# Patient Record
Sex: Female | Born: 1980 | Race: Black or African American | Hispanic: No | Smoking: Current every day smoker
Health system: Southern US, Community
[De-identification: ages and names within clinical notes are randomized; demographics above are authoritative.]

## PROBLEM LIST (undated history)

## (undated) ENCOUNTER — Inpatient Hospital Stay (HOSPITAL_COMMUNITY): Payer: Self-pay

## (undated) DIAGNOSIS — I10 Essential (primary) hypertension: Secondary | ICD-10-CM

## (undated) DIAGNOSIS — E119 Type 2 diabetes mellitus without complications: Secondary | ICD-10-CM

## (undated) HISTORY — PX: TONSILLECTOMY AND ADENOIDECTOMY: SHX28

## (undated) HISTORY — PX: TONSILLECTOMY: SUR1361

---

## 2011-10-15 HISTORY — PX: PILONIDAL CYST EXCISION: SHX744

## 2018-04-28 ENCOUNTER — Ambulatory Visit (INDEPENDENT_AMBULATORY_CARE_PROVIDER_SITE_OTHER): Payer: Self-pay | Admitting: *Deleted

## 2018-04-28 ENCOUNTER — Encounter: Payer: Self-pay | Admitting: General Practice

## 2018-04-28 DIAGNOSIS — Z3201 Encounter for pregnancy test, result positive: Secondary | ICD-10-CM

## 2018-04-28 DIAGNOSIS — Z3200 Encounter for pregnancy test, result unknown: Secondary | ICD-10-CM

## 2018-04-28 LAB — POCT PREGNANCY, URINE: Preg Test, Ur: POSITIVE — AB

## 2018-04-28 NOTE — Progress Notes (Signed)
Charity application given to patient. 

## 2018-04-28 NOTE — Progress Notes (Addendum)
Pt informed of +UPT today. She reports sure LMP 03/17/18. EDD 12/22/18 (6w 0d).  Medication reconciliation completed. Pt advised to begin prenatal vitamins. She  states she has Type 2 DM and CHTN.  She is new to the area and has not been taking medications for approximately 6 months for these conditions. She does not follow a diabetic diet or check blood sugars regularly. Pt was advised to begin adhering to diabetic diet. She denies heavy vaginal bleeding or abdominal/pelvic pain.  She was advised to return to hospital immediately if these sx occur. Pt voiced understanding of all information and instructions given. Upon check out, pt was scheduled for appt w/Diabetes Educator on 7/18 and New Ob appt on 8/5.  Attestation of Attending Supervision of RN: Evaluation and management procedures were performed by the nurse under my supervision and collaboration.  I have reviewed the nursing note and chart, and I agree with the management and plan.  Carolyn L. Harraway-Smith, M.D., Evern CoreFACOG

## 2018-04-30 ENCOUNTER — Encounter: Payer: Medicaid Other | Attending: Obstetrics and Gynecology | Admitting: *Deleted

## 2018-04-30 ENCOUNTER — Ambulatory Visit: Payer: Self-pay | Admitting: *Deleted

## 2018-04-30 DIAGNOSIS — Z3A00 Weeks of gestation of pregnancy not specified: Secondary | ICD-10-CM | POA: Insufficient documentation

## 2018-04-30 DIAGNOSIS — E1165 Type 2 diabetes mellitus with hyperglycemia: Secondary | ICD-10-CM | POA: Diagnosis not present

## 2018-04-30 DIAGNOSIS — Z713 Dietary counseling and surveillance: Secondary | ICD-10-CM | POA: Diagnosis not present

## 2018-04-30 DIAGNOSIS — O24111 Pre-existing diabetes mellitus, type 2, in pregnancy, first trimester: Principal | ICD-10-CM

## 2018-04-30 NOTE — Progress Notes (Signed)
Patient was seen on 04/30/2018 for type 2 Diabetes and pregnancy self-management. EDD 12/22/2018. Patient states history of this diabetes for 10 years.  Diet history obtained. Patient eats good variety of all food groups but beverages include sweet tea, fruit juices and water.  Her comfort level with carb counting is 0/10. Patient is currently on no diabetes medications as she lost her insurance and has not seen a doctor for over 6 months. She recently moved to [place] from the [place] area. She states she was previously on Metformin but could not tolerate the excessive diarrhea, she was then put on Byetta, but could not afford that once she lost her insurance.   The following learning objectives were met by the patient :   States the definition of type 2 Diabetes and pregnancy   States why dietary management is important in controlling blood glucose  Describes the effects of carbohydrates on blood glucose levels  Demonstrates ability to create a balanced meal plan  Demonstrates carbohydrate counting   States when to check blood glucose levels  Demonstrates proper blood glucose monitoring techniques  States the effect of stress and exercise on blood glucose levels  States the importance of limiting caffeine and abstaining from alcohol and smoking  Plan:   Aim for 3 Carb Choices per meal (45 grams) +/- 1 either way   Aim for 1-2 Carbs per snack  Begin reading food labels for Total Carbohydrate of foods  Consider  increasing your activity level by walking or other activity daily as tolerated  Begin checking BG before breakfast and 2 hours after first bite of breakfast, lunch and dinner as directed by MD   Bring Log Book/Sheet to every medical appointment OR use Baby Scripts   Patient was introduced to [employer], she plans to use as record of BG electronically  Take medication as directed by MD  Blood glucose monitor given: [employer] Track Lot # [id number]  Exp: 12/12/2019 Blood  glucose reading: 150 mg/dl after breakfast that included sweet tea  Patient instructed to monitor glucose levels: FBS: 60 - 95 mg/dl 2 hour: <120 mg/dl  Patient received the following handouts:  Nutrition Diabetes and Pregnancy  Carbohydrate Counting List  Patient will be seen for follow-up in 1 month

## 2018-05-04 ENCOUNTER — Encounter: Payer: Self-pay | Admitting: General Practice

## 2018-05-04 NOTE — Progress Notes (Unsigned)
Patient triggered in babyscripts for critical/elevated blood sugar readings. Per review, Dr Earlene Plateravis was contacted by babyscripts regarding her blood sugars. Patient has new OB appt on 8/5.

## 2018-05-06 ENCOUNTER — Encounter: Payer: Self-pay | Admitting: Medical

## 2018-05-06 ENCOUNTER — Other Ambulatory Visit: Payer: Self-pay

## 2018-05-06 ENCOUNTER — Inpatient Hospital Stay (HOSPITAL_COMMUNITY): Payer: Medicaid Other

## 2018-05-06 ENCOUNTER — Inpatient Hospital Stay (HOSPITAL_COMMUNITY)
Admission: AD | Admit: 2018-05-06 | Discharge: 2018-05-06 | Disposition: A | Discharge status: Home / Self Care | Payer: Medicaid Other | Source: Ambulatory Visit | Attending: Obstetrics and Gynecology | Admitting: Obstetrics and Gynecology

## 2018-05-06 DIAGNOSIS — Z882 Allergy status to sulfonamides status: Secondary | ICD-10-CM | POA: Insufficient documentation

## 2018-05-06 DIAGNOSIS — O4691 Antepartum hemorrhage, unspecified, first trimester: Secondary | ICD-10-CM | POA: Diagnosis present

## 2018-05-06 DIAGNOSIS — O200 Threatened abortion: Secondary | ICD-10-CM

## 2018-05-06 DIAGNOSIS — O209 Hemorrhage in early pregnancy, unspecified: Secondary | ICD-10-CM

## 2018-05-06 DIAGNOSIS — Z87891 Personal history of nicotine dependence: Secondary | ICD-10-CM | POA: Diagnosis not present

## 2018-05-06 DIAGNOSIS — O24911 Unspecified diabetes mellitus in pregnancy, first trimester: Secondary | ICD-10-CM | POA: Diagnosis not present

## 2018-05-06 DIAGNOSIS — O161 Unspecified maternal hypertension, first trimester: Secondary | ICD-10-CM | POA: Diagnosis not present

## 2018-05-06 DIAGNOSIS — Z3A01 Less than 8 weeks gestation of pregnancy: Secondary | ICD-10-CM | POA: Insufficient documentation

## 2018-05-06 DIAGNOSIS — Z885 Allergy status to narcotic agent status: Secondary | ICD-10-CM | POA: Diagnosis not present

## 2018-05-06 HISTORY — DX: Essential (primary) hypertension: I10

## 2018-05-06 HISTORY — DX: Type 2 diabetes mellitus without complications: E11.9

## 2018-05-06 LAB — ABO/RH: ABO/RH(D): O POS

## 2018-05-06 LAB — CBC WITH DIFFERENTIAL/PLATELET
BASOS PCT: 0 %
Basophils Absolute: 0 10*3/uL (ref 0.0–0.1)
EOS ABS: 0 10*3/uL (ref 0.0–0.7)
EOS PCT: 1 %
HCT: 38.8 % (ref 36.0–46.0)
HEMOGLOBIN: 14 g/dL (ref 12.0–15.0)
Lymphocytes Relative: 40 %
Lymphs Abs: 2.7 10*3/uL (ref 0.7–4.0)
MCH: 30.4 pg (ref 26.0–34.0)
MCHC: 36.1 g/dL — AB (ref 30.0–36.0)
MCV: 84.2 fL (ref 78.0–100.0)
MONOS PCT: 2 %
Monocytes Absolute: 0.1 10*3/uL (ref 0.1–1.0)
NEUTROS PCT: 57 %
Neutro Abs: 3.8 10*3/uL (ref 1.7–7.7)
PLATELETS: 292 10*3/uL (ref 150–400)
RBC: 4.61 MIL/uL (ref 3.87–5.11)
RDW: 11.8 % (ref 11.5–15.5)
WBC: 6.6 10*3/uL (ref 4.0–10.5)

## 2018-05-06 LAB — WET PREP, GENITAL
CLUE CELLS WET PREP: NONE SEEN
Sperm: NONE SEEN
Trich, Wet Prep: NONE SEEN
Yeast Wet Prep HPF POC: NONE SEEN

## 2018-05-06 LAB — HCG, QUANTITATIVE, PREGNANCY: HCG, BETA CHAIN, QUANT, S: 678 m[IU]/mL — AB (ref <5)

## 2018-05-06 LAB — URINALYSIS, ROUTINE W REFLEX MICROSCOPIC
Bacteria, UA: NONE SEEN
Bilirubin Urine: NEGATIVE
Glucose, UA: >=500 mg/dL — AB
Ketones, ur: NEGATIVE mg/dL
Nitrite: NEGATIVE
Protein, ur: NEGATIVE mg/dL
Specific Gravity, Urine: 1.016 (ref 1.005–1.030)
pH: 6 (ref 5.0–8.0)

## 2018-05-06 NOTE — MAU Provider Note (Signed)
History     CSN: [id number]  Arrival date and time: 05/06/18 4098   First Provider Initiated Contact with Patient 05/06/18 [phone]      Chief Complaint  Patient presents with  . Vaginal Bleeding  . Abdominal Pain   HPI Ms. Debra Floyd is a 37 y.o. G2P1001 at [redacted]w[redacted]d who presents to MAU today with complaint of vaginal bleeding and abdominal cramping. The patient states that she noted a pink discharge last week and that became heavier and darker ~ 3 days ago. She states last intercourse was last Tuesday. She rates her abdominal pain at 3-4/10. She has not taken anything for pain. She denies fever, N/V/D or UTI symptoms. She has CHTN and T2DM and is not currently on any medications.   OB History    Gravida  2   Para  1   Term  1   Preterm      AB      Living  1     SAB      TAB      Ectopic      Multiple      Live Births              Past Medical History:  Diagnosis Date  . Diabetes mellitus without complication (HCC)   . Hypertension     Past Surgical History:  Procedure Laterality Date  . CESAREAN SECTION    . TONSILLECTOMY    . TONSILLECTOMY AND ADENOIDECTOMY      History reviewed. No pertinent family history.  Social History   Tobacco Use  . Smoking status: Former Smoker    Types: Cigars    Last attempt to quit: 04/16/2018    Years since quitting: 0.0  . Smokeless tobacco: Never Used  Substance Use Topics  . Alcohol use: Not Currently  . Drug use: Never    Allergies:  Allergies  Allergen Reactions  . Hydrocodone Hives  . Sulfa Antibiotics Hives    No medications prior to admission.    Review of Systems  Constitutional: Negative for fever.  Gastrointestinal: Positive for abdominal pain. Negative for constipation, diarrhea, nausea and vomiting.  Genitourinary: Positive for vaginal bleeding and vaginal discharge. Negative for dysuria, frequency and urgency.   Physical Exam   Blood pressure (!) 142/86, pulse 67, temperature 98.4 F  (36.9 C), temperature source Oral, resp. rate 18, height 5\' 3"  (1.6 m), weight 241 lb 0.6 oz (109.3 kg), last menstrual period 03/17/2018, SpO2 100 %.  Physical Exam  Nursing note and vitals reviewed. Constitutional: She is oriented to person, place, and time. She appears well-developed and well-nourished. No distress.  HENT:  Head: Normocephalic and atraumatic.  Cardiovascular: Normal rate.  Respiratory: Effort normal.  GI: Soft. She exhibits no distension and no mass. There is no tenderness. There is no rebound and no guarding.  Genitourinary: Uterus is not enlarged and not tender. Cervix exhibits no motion tenderness, no discharge and no friability. Right adnexum displays no mass and no tenderness. Left adnexum displays no mass and no tenderness. There is bleeding (small) in the vagina. No vaginal discharge found.  Neurological: She is alert and oriented to person, place, and time.  Skin: Skin is warm and dry. No erythema.  Psychiatric: She has a normal mood and affect.    Results for orders placed or performed during the hospital encounter of 05/06/18 (from the past 24 hour(s))  Urinalysis, Routine w reflex microscopic     Status: Abnormal   Collection Time:  05/06/18  9:10 AM  Result Value Ref Range   Color, Urine YELLOW YELLOW   APPearance HAZY (A) CLEAR   Specific Gravity, Urine 1.016 1.005 - 1.030   pH 6.0 5.0 - 8.0   Glucose, UA >=500 (A) NEGATIVE mg/dL   Hgb urine dipstick LARGE (A) NEGATIVE   Bilirubin Urine NEGATIVE NEGATIVE   Ketones, ur NEGATIVE NEGATIVE mg/dL   Protein, ur NEGATIVE NEGATIVE mg/dL   Nitrite NEGATIVE NEGATIVE   Leukocytes, UA MODERATE (A) NEGATIVE   RBC / HPF 0-5 0 - 5 RBC/hpf   WBC, UA 21-50 0 - 5 WBC/hpf   Bacteria, UA NONE SEEN NONE SEEN   Squamous Epithelial / LPF 11-20 0 - 5   Mucus PRESENT   CBC with Differential/Platelet     Status: Abnormal   Collection Time: 05/06/18  9:43 AM  Result Value Ref Range   WBC 6.6 4.0 - 10.5 K/uL   RBC 4.61  3.87 - 5.11 MIL/uL   Hemoglobin 14.0 12.0 - 15.0 g/dL   HCT 21.338.8 08.636.0 - 57.846.0 %   MCV 84.2 78.0 - 100.0 fL   MCH 30.4 26.0 - 34.0 pg   MCHC 36.1 (H) 30.0 - 36.0 g/dL   RDW 46.911.8 62.911.5 - 52.815.5 %   Platelets 292 150 - 400 K/uL   Neutrophils Relative % 57 %   Neutro Abs 3.8 1.7 - 7.7 K/uL   Lymphocytes Relative 40 %   Lymphs Abs 2.7 0.7 - 4.0 K/uL   Monocytes Relative 2 %   Monocytes Absolute 0.1 0.1 - 1.0 K/uL   Eosinophils Relative 1 %   Eosinophils Absolute 0.0 0.0 - 0.7 K/uL   Basophils Relative 0 %   Basophils Absolute 0.0 0.0 - 0.1 K/uL  ABO/Rh     Status: None   Collection Time: 05/06/18  9:43 AM  Result Value Ref Range   ABO/RH(D)      O POS Performed at [facility], [address] [place], [place] [address]   hCG, quantitative, pregnancy     Status: Abnormal   Collection Time: 05/06/18  9:43 AM  Result Value Ref Range   hCG, Beta Chain, Quant, S 678 (H) <5 mIU/mL  Wet prep, genital     Status: Abnormal   Collection Time: 05/06/18  9:59 AM  Result Value Ref Range   Yeast Wet Prep HPF POC NONE SEEN NONE SEEN   Trich, Wet Prep NONE SEEN NONE SEEN   Clue Cells Wet Prep HPF POC NONE SEEN NONE SEEN   WBC, Wet Prep HPF POC FEW (A) NONE SEEN   Sperm NONE SEEN    [place] Ob Comp Less 14 Wks  Result Date: 05/06/2018 CLINICAL DATA:  Vaginal bleeding. Gestational age by LMP of 7 weeks 1 day. EXAM: OBSTETRIC <14 WK US AND TRANSVAGINAL OB US TECHNIQUE: Both transabdominal and transvaginal ultrasound examinations were performed for complete evaluation of the gestation as well as the maternal uterus, adnexal regions, and pelvic cul-de-sac. Transvaginal technique was performed to assess early pregnancy. COMPARISON:  None. FINDINGS: Intrauterine gestational sac: Single, located in lower uterine segment Yolk sac:  Visualized. Embryo:  Visualized. Cardiac Activity: Visualized. Heart Rate: 62 bpm CRL:  5 mm   6 w   1 d                  US EDC: 12/29/2018 Subchorionic hemorrhage:  None  visualized. Maternal uterus/adnexae: Neither ovary well visualized on this study, however no mass or abnormal free fluid identified.  IMPRESSION: Single living IUP measuring 6 weeks 1 day, with [place] EDC of 12/29/2018. Poor prognostic factors including low heart rate and location in lower uterine segment raise suspicion for spontaneous abortion in progress. Electronically Signed   By: Myles Rosenthal M.D.   On: 05/06/2018 11:23   US Ob Transvaginal  Result Date: 05/06/2018 CLINICAL DATA:  Vaginal bleeding. Gestational age by LMP of 7 weeks 1 day. EXAM: OBSTETRIC <14 WK [place] AND TRANSVAGINAL OB US TECHNIQUE: Both transabdominal and transvaginal ultrasound examinations were performed for complete evaluation of the gestation as well as the maternal uterus, adnexal regions, and pelvic cul-de-sac. Transvaginal technique was performed to assess early pregnancy. COMPARISON:  None. FINDINGS: Intrauterine gestational sac: Single, located in lower uterine segment Yolk sac:  Visualized. Embryo:  Visualized. Cardiac Activity: Visualized. Heart Rate: 62 bpm CRL:  5 mm   6 w   1 d                  [place] EDC: 12/29/2018 Subchorionic hemorrhage:  None visualized. Maternal uterus/adnexae: Neither ovary well visualized on this study, however no mass or abnormal free fluid identified. IMPRESSION: Single living IUP measuring 6 weeks 1 day, with [place] EDC of 12/29/2018. Poor prognostic factors including low heart rate and location in lower uterine segment raise suspicion for spontaneous abortion in progress. Electronically Signed   By: Myles Rosenthal M.D.   On: 05/06/2018 11:23    MAU Course  Procedures None  MDM +UPT UA, wet prep, GC/chlamydia, CBC, ABO/Rh, quant hCG, HIV, RPR and [place] today to rule out ectopic pregnancy  Assessment and Plan  A: SIUP at [redacted]w[redacted]d Fetal bradycardia  Threatened miscarriage  CHTN T2DM   P: Discharge home Tylenol PRN for pain advised  Bleeding precautions and pelvic rest discussed Patient advised to  follow-up with [facility] [place] for repeat US for viability in 1 week. Order placed. Patient to follow-up with [facility] for results after [place].  Patient may return to MAU as needed or if her condition were to change or worsen  Vonzella Nipple, PA-C 05/06/2018, 4:53 PM

## 2018-05-06 NOTE — MAU Note (Signed)
Pt. States vaginal bleeding began 3 days ago. Pt. Reports bleeding has gotten worse since it first started. Pt. Reports abd. Cramping and some scant dc

## 2018-05-06 NOTE — MAU Provider Note (Signed)
History     CSN: [id number]  Arrival date and time: 05/06/18 6962   First Provider Initiated Contact with Patient 05/06/18 [phone]      Chief Complaint  Patient presents with  . Vaginal Bleeding  . Abdominal Pain   HPI  Ms. Debra Floyd is a G2P1001 [redacted]w[redacted]d with a past medical history significant for hypertension and DM who presents with a chief complaint of vaginal bleeding. Patient states she noticed a light pink discharge about a week ago and yesterday it progressed to a brownish-red tinge. She states she changed panty liners about 3 times yesterday which were saturated. She is experiencing some abdominal pain accompanied with the bleeding that is like menstrual cramps and is a 3-4/10. She has noticed a thin discharge coming from her vagina but is unsure of the character of it given the vaginal bleeding. Of note, patient had gonorrhea with her first child.   OB History    Gravida  2   Para  1   Term  1   Preterm      AB      Living  1     SAB      TAB      Ectopic      Multiple      Live Births              Past Medical History:  Diagnosis Date  . Diabetes mellitus without complication (HCC)   . Hypertension     Past Surgical History:  Procedure Laterality Date  . CESAREAN SECTION    . TONSILLECTOMY    . TONSILLECTOMY AND ADENOIDECTOMY      History reviewed. No pertinent family history.  Social History   Tobacco Use  . Smoking status: Former Smoker    Types: Cigars    Last attempt to quit: 04/16/2018    Years since quitting: 0.0  . Smokeless tobacco: Never Used  Substance Use Topics  . Alcohol use: Not Currently  . Drug use: Never    Allergies:  Allergies  Allergen Reactions  . Hydrocodone Hives  . Sulfa Antibiotics Hives    No medications prior to admission.    Review of Systems  Constitutional: Negative for chills and fever.  HENT: Negative.   Eyes: Negative.   Respiratory: Negative for shortness of breath.   Cardiovascular:  Negative for chest pain, palpitations and leg swelling.  Gastrointestinal: Positive for abdominal pain. Negative for constipation, diarrhea, nausea and vomiting.  Genitourinary: Positive for vaginal bleeding and vaginal discharge. Negative for difficulty urinating, dysuria and hematuria.  Neurological: Negative for dizziness and headaches.   Physical Exam   Blood pressure (!) 153/89, pulse 76, temperature 98.4 F (36.9 C), temperature source Oral, resp. rate 18, height 5\' 3"  (1.6 m), weight 109.3 kg (241 lb 0.6 oz), last menstrual period 03/17/2018, SpO2 100 %.  Physical Exam  Constitutional: She appears well-developed and well-nourished. No distress.  HENT:  Head: Normocephalic and atraumatic.  Cardiovascular: Normal rate and regular rhythm. Exam reveals no gallop and no friction rub.  No murmur heard. Respiratory: Breath sounds normal.  GI: Soft. Bowel sounds are normal. There is no tenderness.  Genitourinary: There is bleeding in the vagina.  Genitourinary Comments: Some bleeding noted from external os. External os is closed per bimanual.  Musculoskeletal: She exhibits no edema.  Skin: Skin is warm and dry.    MAU Course   MDM UA CBC OB US bhCG ABO/rh HIV RPR G/C Wet prep Results for orders  placed or performed during the hospital encounter of 05/06/18 (from the past 24 hour(s))  Urinalysis, Routine w reflex microscopic     Status: Abnormal   Collection Time: 05/06/18  9:10 AM  Result Value Ref Range   Color, Urine YELLOW YELLOW   APPearance HAZY (A) CLEAR   Specific Gravity, Urine 1.016 1.005 - 1.030   pH 6.0 5.0 - 8.0   Glucose, UA >=500 (A) NEGATIVE mg/dL   Hgb urine dipstick LARGE (A) NEGATIVE   Bilirubin Urine NEGATIVE NEGATIVE   Ketones, ur NEGATIVE NEGATIVE mg/dL   Protein, ur NEGATIVE NEGATIVE mg/dL   Nitrite NEGATIVE NEGATIVE   Leukocytes, UA MODERATE (A) NEGATIVE   RBC / HPF 0-5 0 - 5 RBC/hpf   WBC, UA 21-50 0 - 5 WBC/hpf   Bacteria, UA NONE SEEN NONE  SEEN   Squamous Epithelial / LPF 11-20 0 - 5   Mucus PRESENT   CBC with Differential/Platelet     Status: Abnormal   Collection Time: 05/06/18  9:43 AM  Result Value Ref Range   WBC 6.6 4.0 - 10.5 K/uL   RBC 4.61 3.87 - 5.11 MIL/uL   Hemoglobin 14.0 12.0 - 15.0 g/dL   HCT 47.838.8 29.536.0 - 62.146.0 %   MCV 84.2 78.0 - 100.0 fL   MCH 30.4 26.0 - 34.0 pg   MCHC 36.1 (H) 30.0 - 36.0 g/dL   RDW 30.811.8 65.711.5 - 84.615.5 %   Platelets 292 150 - 400 K/uL   Neutrophils Relative % 57 %   Neutro Abs 3.8 1.7 - 7.7 K/uL   Lymphocytes Relative 40 %   Lymphs Abs 2.7 0.7 - 4.0 K/uL   Monocytes Relative 2 %   Monocytes Absolute 0.1 0.1 - 1.0 K/uL   Eosinophils Relative 1 %   Eosinophils Absolute 0.0 0.0 - 0.7 K/uL   Basophils Relative 0 %   Basophils Absolute 0.0 0.0 - 0.1 K/uL  ABO/Rh     Status: None (Preliminary result)   Collection Time: 05/06/18  9:43 AM  Result Value Ref Range   ABO/RH(D)      O POS Performed at [facility], [address] [place], [place] [address]   hCG, quantitative, pregnancy     Status: Abnormal   Collection Time: 05/06/18  9:43 AM  Result Value Ref Range   hCG, Beta Chain, Quant, S 678 (H) <5 mIU/mL  Wet prep, genital     Status: Abnormal   Collection Time: 05/06/18  9:59 AM  Result Value Ref Range   Yeast Wet Prep HPF POC NONE SEEN NONE SEEN   Trich, Wet Prep NONE SEEN NONE SEEN   Clue Cells Wet Prep HPF POC NONE SEEN NONE SEEN   WBC, Wet Prep HPF POC FEW (A) NONE SEEN   Sperm NONE SEEN     Assessment and Plan  Vaginal Bleeding, First Trimester  Discharge to home Follow up OB US in 7 days Patient educated on vaginal bleeding precautions Patient advised to return to MAU if condition changes or worsens   Candie MileLindsey R Dashana Guizar 05/06/2018, 9:57 AM

## 2018-05-06 NOTE — Discharge Instructions (Signed)
Threatened Miscarriage °A threatened miscarriage is when you have vaginal bleeding during your first 20 weeks of pregnancy but the pregnancy has not ended. Your doctor will do tests to make sure you are still pregnant. The cause of the bleeding may not be known. This condition does not mean your pregnancy will end. It does increase the risk of it ending (complete miscarriage). °Follow these instructions at home: °· Make sure you keep all your doctor visits for prenatal care. °· Get plenty of rest. °· Do not have sex or use tampons if you have vaginal bleeding. °· Do not douche. °· Do not smoke or use drugs. °· Do not drink alcohol. °· Avoid caffeine. °Contact a doctor if: °· You have light bleeding from your vagina. °· You have belly pain or cramping. °· You have a fever. °Get help right away if: °· You have heavy bleeding from your vagina. °· You have clots of blood coming from your vagina. °· You have bad pain or cramps in your low back or belly. °· You have fever, chills, and bad belly pain. °This information is not intended to replace advice given to you by your health care provider. Make sure you discuss any questions you have with your health care provider. °Document Released: 09/12/2008 Document Revised: 03/07/2016 Document Reviewed: 07/27/2013 °Elsevier Interactive Patient Education © 2018 Elsevier Inc[employer] ° ° °Pelvic Rest °Pelvic rest may be recommended if: °· Your placenta is partially or completely covering the opening of your cervix (placenta previa). °· There is bleeding between the wall of the uterus and the amniotic sac in the first trimester of pregnancy (subchorionic hemorrhage). °· You went into labor too early (preterm labor). ° °Based on your overall health and the health of your baby, your health care provider will decide if pelvic rest is right for you. °How do I rest my pelvis? °For as long as told by your health care provider: °· Do not have sex, sexual stimulation, or an orgasm. °· Do not use  tampons. Do not douche. Do not put anything in your vagina. °· Do not lift anything that is heavier than 10 lb (4.5 kg). °· Avoid activities that take a lot of effort (are strenuous). °· Avoid any activity in which your pelvic muscles could become strained. ° °When should I seek medical care? °Seek medical care if you have: °· Cramping pain in your lower abdomen. °· Vaginal discharge. °· A low, dull backache. °· Regular contractions. °· Uterine tightening. ° °When should I seek immediate medical care? °Seek immediate medical care if: °· You have vaginal bleeding and you are pregnant. ° °This information is not intended to replace advice given to you by your health care provider. Make sure you discuss any questions you have with your health care provider. °Document Released: 01/25/2011 Document Revised: 03/07/2016 Document Reviewed: 04/03/2015 °Elsevier Interactive Patient Education © 2018 Elsevier Inc. ° °

## 2018-05-07 LAB — GC/CHLAMYDIA PROBE AMP (~~LOC~~) NOT AT ARMC
Chlamydia: NEGATIVE
NEISSERIA GONORRHEA: NEGATIVE

## 2018-05-07 LAB — RPR: RPR Ser Ql: NONREACTIVE

## 2018-05-07 LAB — HIV ANTIBODY (ROUTINE TESTING W REFLEX): HIV Screen 4th Generation wRfx: NONREACTIVE

## 2018-05-08 ENCOUNTER — Other Ambulatory Visit: Payer: Self-pay | Admitting: Obstetrics and Gynecology

## 2018-05-08 MED ORDER — METFORMIN HCL 500 MG PO TABS: 500.0000 mg | ORAL_TABLET | Freq: Every day | ORAL | 3 refills | Status: AC

## 2018-05-08 NOTE — Progress Notes (Signed)
Patient with several elevated CBGs in baby scripts app. Reviewed with patient, she was previously on bystetta but not on meds in several months due to insurance. She is agreeable to restart metformin for now, reviewed we will likely start her on insulin but will discuss further at her next visit on 05/18/18. She is agreeable.   Debra LenisK. Meryl Davis, M.D. [facility]

## 2018-05-11 ENCOUNTER — Encounter: Payer: Self-pay | Admitting: General Practice

## 2018-05-11 NOTE — Progress Notes (Signed)
Received call & email from [employer] regarding critical fasting blood sugar value of 153. Per chart review, patient has upcoming appt 8/5 and recently spoke with Dr Earlene Plateravis. Will forward to her.

## 2018-05-12 ENCOUNTER — Ambulatory Visit (INDEPENDENT_AMBULATORY_CARE_PROVIDER_SITE_OTHER): Payer: Self-pay | Admitting: *Deleted

## 2018-05-12 ENCOUNTER — Ambulatory Visit (HOSPITAL_COMMUNITY)
Admission: RE | Admit: 2018-05-12 | Discharge: 2018-05-12 | Disposition: A | Discharge status: Home / Self Care | Payer: Medicaid Other | Source: Ambulatory Visit | Attending: Medical | Admitting: Medical

## 2018-05-12 DIAGNOSIS — O209 Hemorrhage in early pregnancy, unspecified: Secondary | ICD-10-CM | POA: Insufficient documentation

## 2018-05-12 DIAGNOSIS — Z3A00 Weeks of gestation of pregnancy not specified: Secondary | ICD-10-CM | POA: Diagnosis not present

## 2018-05-12 DIAGNOSIS — O200 Threatened abortion: Secondary | ICD-10-CM | POA: Insufficient documentation

## 2018-05-12 NOTE — Progress Notes (Signed)
Here for US results. Results reviewed with Dr.Ervin and informed patient us shows she has likely had a miscarriage. Patient teary, support given. Informed her he reccommends routine bhcg in one week and provider fu in 2 weeks. Also advised come mau if severe pain, heavy bleeding, etc. She voices understanding.

## 2018-05-12 NOTE — Progress Notes (Signed)
Agree with A & P. 

## 2018-05-18 ENCOUNTER — Encounter: Payer: Self-pay | Admitting: Family Medicine

## 2018-05-18 ENCOUNTER — Other Ambulatory Visit: Payer: Self-pay | Admitting: *Deleted

## 2018-05-18 DIAGNOSIS — O200 Threatened abortion: Secondary | ICD-10-CM

## 2018-05-19 ENCOUNTER — Other Ambulatory Visit: Payer: Self-pay

## 2018-05-19 DIAGNOSIS — O200 Threatened abortion: Secondary | ICD-10-CM

## 2018-05-20 LAB — BETA HCG QUANT (REF LAB): hCG Quant: 3 m[IU]/mL

## 2018-05-27 ENCOUNTER — Encounter: Payer: Self-pay | Admitting: Medical

## 2018-05-27 ENCOUNTER — Ambulatory Visit (INDEPENDENT_AMBULATORY_CARE_PROVIDER_SITE_OTHER): Payer: Medicaid Other | Admitting: Medical

## 2018-05-27 VITALS — BP 157/92 | HR 76 | Ht 63.0 in | Wt 244.3 lb

## 2018-05-27 DIAGNOSIS — O039 Complete or unspecified spontaneous abortion without complication: Secondary | ICD-10-CM

## 2018-05-27 DIAGNOSIS — I10 Essential (primary) hypertension: Secondary | ICD-10-CM | POA: Diagnosis not present

## 2018-05-27 DIAGNOSIS — E119 Type 2 diabetes mellitus without complications: Secondary | ICD-10-CM | POA: Diagnosis not present

## 2018-05-27 NOTE — Progress Notes (Signed)
History:  Ms. Ed Blalockynisha Deshong is a 37 y.o. G2P1001 who presents to clinic today for follow-up after SAB. The patient has SIUP noted with fetal bradycardia on 05/06/18. She returned for a follow-up US for viability on 05/12/18 and the IUP was no longer visualized. This was consistent with SAB. She had negative hCG on 05/19/18. She denies vaginal bleeding or abdominal pain today. She states that her and SO have discussed and would like to consider birth control at this time. She has been sexually active without protection since SAB, last intercourse was 2 days ago.    The following portions of the patient's history were reviewed and updated as appropriate: allergies, current medications, family history, past medical history, social history, past surgical history and problem list.  Review of Systems:  Review of Systems  Constitutional: Negative for fever and malaise/fatigue.  Gastrointestinal: Negative for abdominal pain, constipation, diarrhea, nausea and vomiting.  Genitourinary:       Neg -vaginal bleeding      Objective:  Physical Exam BP (!) 157/92   Pulse 76   Ht 5\' 3"  (1.6 m)   Wt 244 lb 4.8 oz (110.8 kg)   LMP 03/17/2018   BMI 43.28 kg/m  Physical Exam  Constitutional: She is oriented to person, place, and time. She appears well-developed and well-nourished. No distress.  HENT:  Head: Normocephalic.  Cardiovascular: Normal rate.  Pulmonary/Chest: Effort normal.  Abdominal: Soft.  Neurological: She is alert and oriented to person, place, and time.  Skin: Skin is warm and dry. No erythema.  Psychiatric: She has a normal mood and affect.  Vitals reviewed.   Labs and Imaging 05/19/18 - hCG = 3  Assessment & Plan:  1. Hypertension, unspecified type - [facility] - Warning signs for worsening HTN and CVA discussed   2. Diabetes mellitus without complication (HCC) - [facility]  3. Miscarriage - Complete - Patient is  interested in IUD and will return in ~ 2 weeks for insertion as long as UPT is negative - Has support of SO, mother, aunts and church family and is feeling better emotionally about the miscarriage at this time   Kathlene CoteWenzel, Franke Menter N, PA-C 05/27/2018 10:31 AM

## 2018-05-27 NOTE — Patient Instructions (Signed)

## 2018-05-27 NOTE — Progress Notes (Signed)
History:  Ms. Debra Floyd Floyd is a 37 y.o. G2P1001 with past medical history of hypertension, T2DM, and miscarriage (05/06/18) who presents to clinic today for a miscarriage follow-up. Debra Floyd Floyd reports no bleeding or pain since the miscarriage. She has not had a period yet. She has been sexually active without protection with her last intercourse taking place 2 days ago. She does not wish to conceive ever again as far as she can tell right now. She is interested in contraception. She used Depo in the past but did not like the weight gain side effect. She expressed interest in a BLT but also wanted to learn about other options.   The following portions of the patient's history were reviewed and updated as appropriate: allergies, current medications, family history, past medical history, social history, past surgical history and problem list.  Review of Systems:  Review of Systems  Constitutional: Negative for fever and malaise/fatigue.  Cardiovascular: Negative for chest pain.  Gastrointestinal: Negative for nausea.  Genitourinary: Negative.   Neurological: Positive for headaches (mild bilateral frontal headache).  Psychiatric/Behavioral: Negative for depression.      Objective:  Physical Exam BP (!) 157/92   Pulse 76   Ht 5\' 3"  (1.6 m)   Wt 244 lb 4.8 oz (110.8 kg)   LMP 03/17/2018   BMI 43.28 kg/m  Physical Exam  Constitutional: She is oriented to person, place, and time. She appears well-developed and well-nourished. No distress.  Neck: Phonation normal.  Pulmonary/Chest: Effort normal.  Neurological: She is alert and oriented to person, place, and time.  Psychiatric: She has a normal mood and affect. Her speech is normal and behavior is normal. Judgment and thought content normal. Cognition and memory are normal.   Labs and Imaging No results found for this or any previous visit (from the past 24 hour(s)).  No results found. Beta-HCG (05/19/18): 3 mIU/mL  Assessment & Plan:  1.  Hypertension, unspecified type - [facility]; patient will f/u with them - BP consistently elevated (142/86 on 7/24, 157/92 (05/27/18); not currently on medication  - Counseled patient on warnings signs which should prompt Debra Floyd visit, e.g. Vision changes, headache, chest pain, SOB  2. Diabetes mellitus without complication (HCC) - [facility]; patient will f/u with them - Reports sugars in the 150's in the morning and 200's in the evenings (patient thinks last A1c was 9 but no labs available); patient checks sugars BID.  - Currently taking 500mg  Metformin 1x/day (had diarrhea when on 1000mg  in the past); had tried Byetta in the past but not currently taking   3. Miscarriage - beta-HCG WNL (3 mIU/mL) on 05/19/18 - No further f/u needed  4. Contraception - Counseled on variety of contraception options and patient is interested in IUD  - IUD placement visit scheduled 2 weeks from now if pregnancy test is negative at time of visit - Advised patient to use condoms or practice abstinence between now and IUD placement appointment  Debra Floyd Floyd, Debra Floyd Floyd, Medical Student 05/27/2018 10:41 AM

## 2018-06-02 ENCOUNTER — Other Ambulatory Visit: Payer: Self-pay

## 2018-06-17 ENCOUNTER — Ambulatory Visit: Payer: Medicaid Other | Admitting: Medical

## 2018-09-15 ENCOUNTER — Telehealth: Payer: Self-pay

## 2018-09-15 DIAGNOSIS — O10919 Unspecified pre-existing hypertension complicating pregnancy, unspecified trimester: Secondary | ICD-10-CM

## 2018-09-15 NOTE — Telephone Encounter (Signed)
Pt called stated that she is pregnant and lives in [place] and needs a [facility].

## 2018-09-17 IMAGING — US US OB COMP LESS 14 WK
1 series · 15 of 28 positions shown · non-contrast
Comparison: None.

CLINICAL DATA: Vaginal bleeding. Gestational age by LMP of 7 weeks
1 day.

EXAM:
OBSTETRIC <14 WK US AND TRANSVAGINAL OB US
TECHNIQUE: Both transabdominal and transvaginal ultrasound examinations were
performed for complete evaluation of the gestation as well as the
maternal uterus, adnexal regions, and pelvic cul-de-sac.
Transvaginal technique was performed to assess early pregnancy.

[Series 1: us ob comp less 14 wk · 30 acquisitions, 15 frames shown]
[im 1/30]
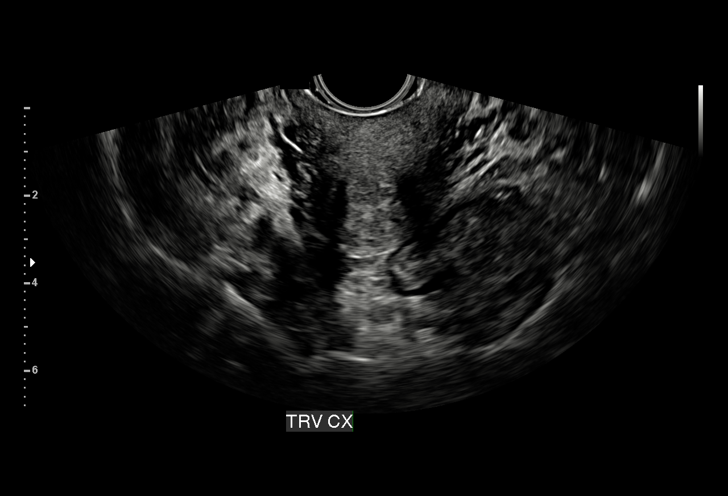
[im 3/30]
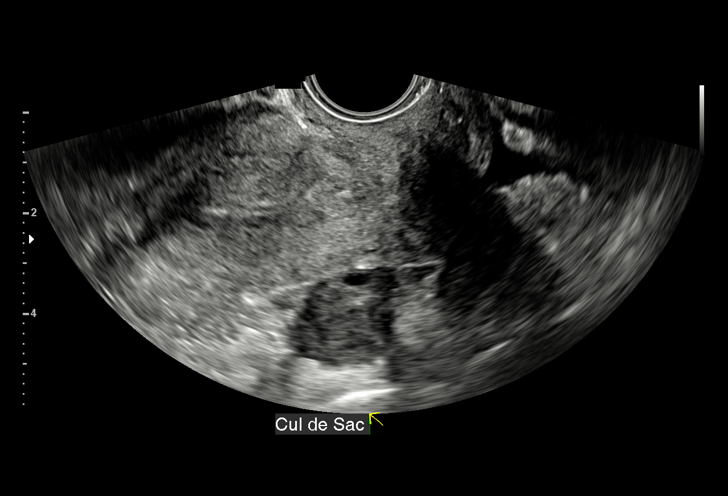
[im 5/30]
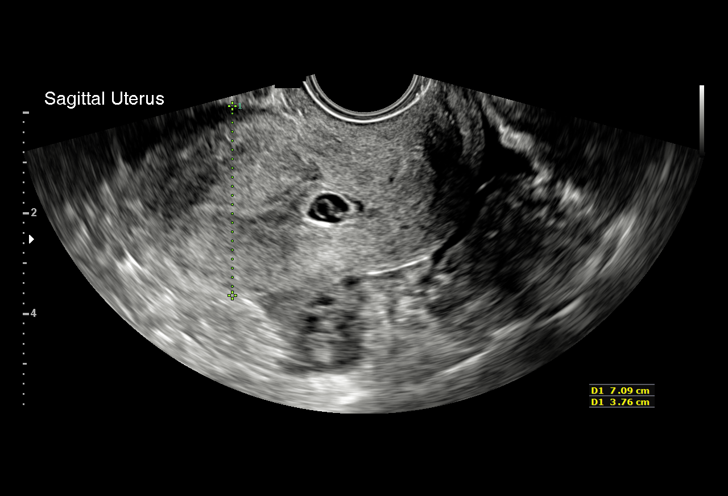
[im 7/30]
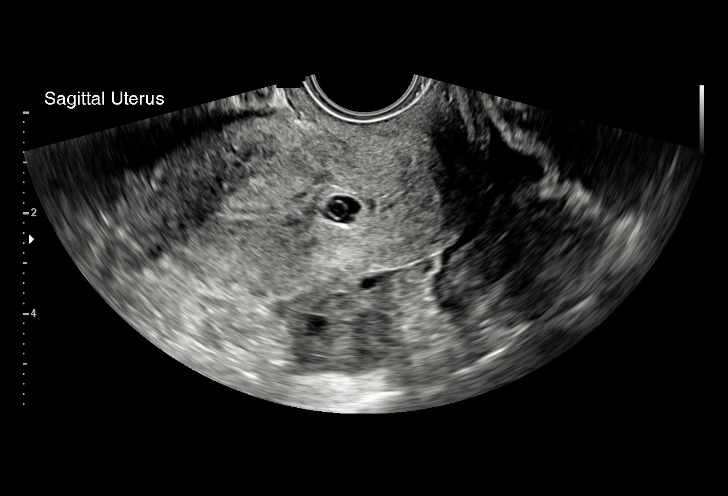
[im 9/30]
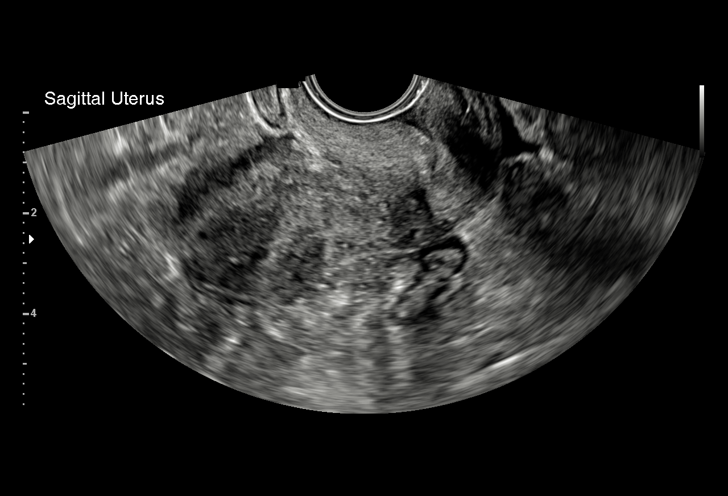
[im 11/30]
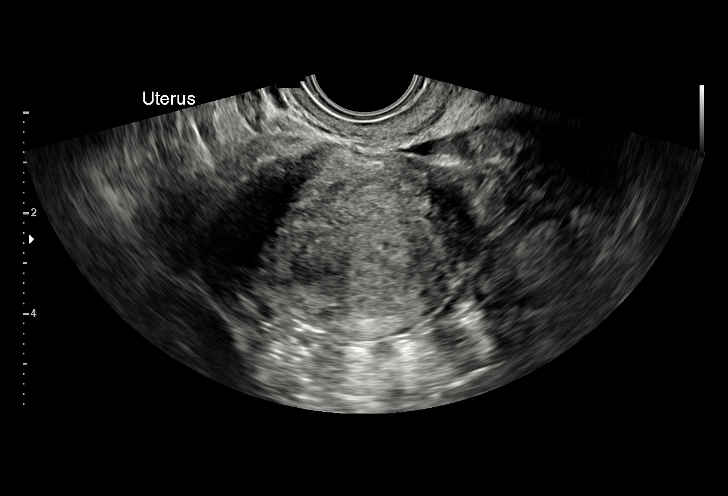
[im 13/30]
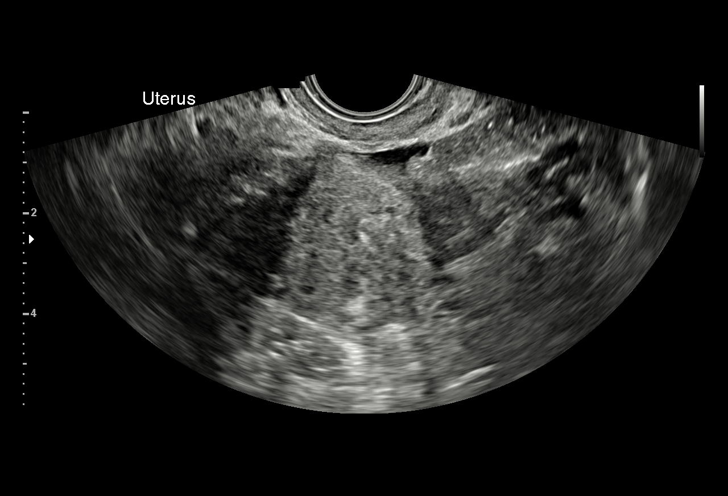
[im 16/30]
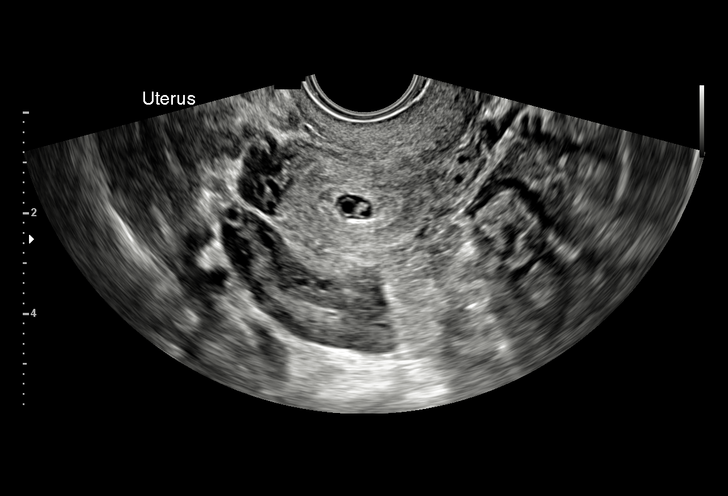
[im 17/30]
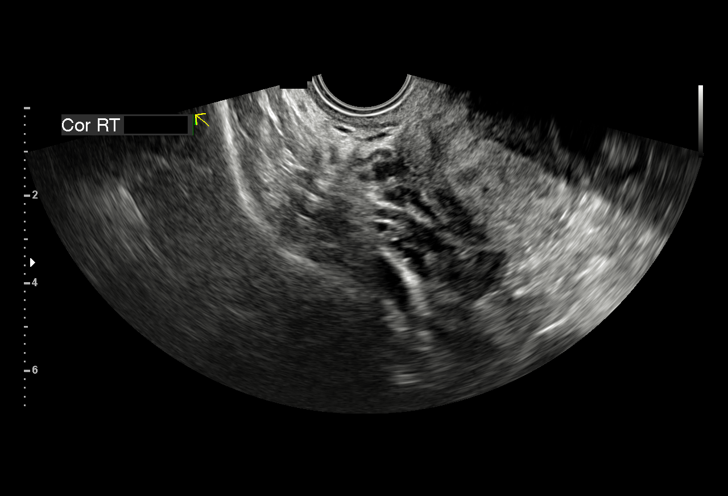
[im 19/30]
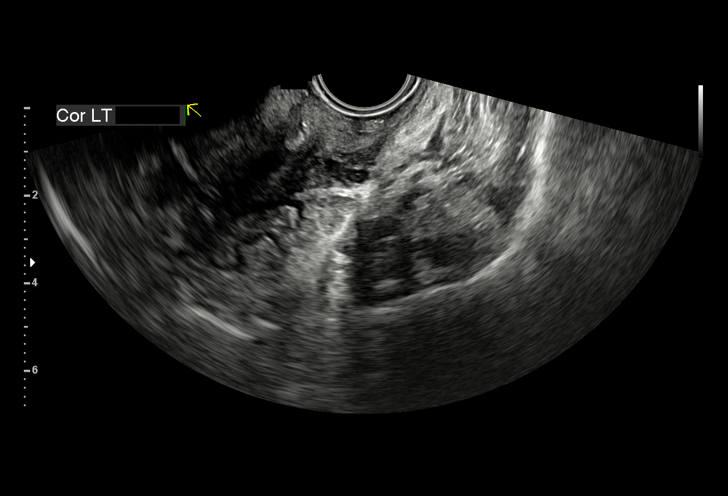
[im 21/30]
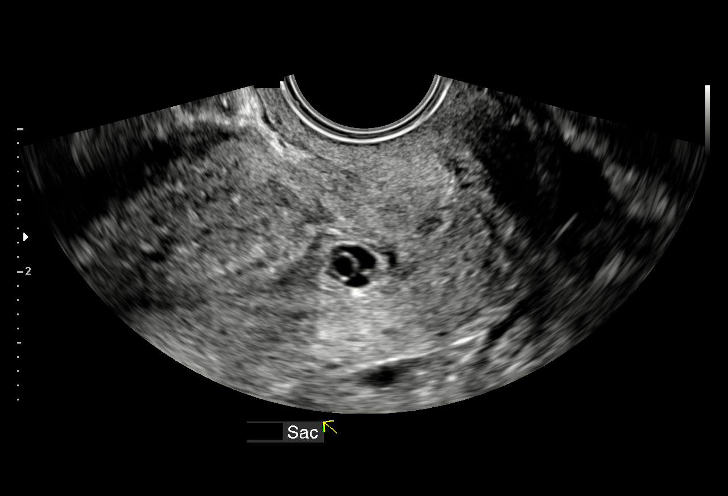
[im 23/30]
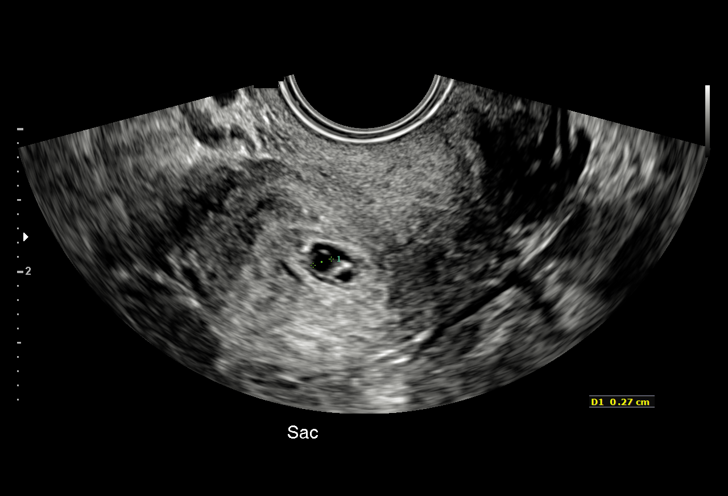
[im 25/30]
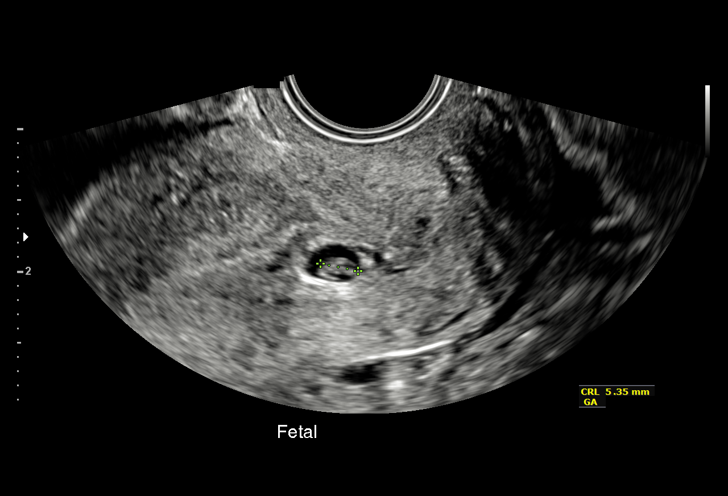
[im 27/30]
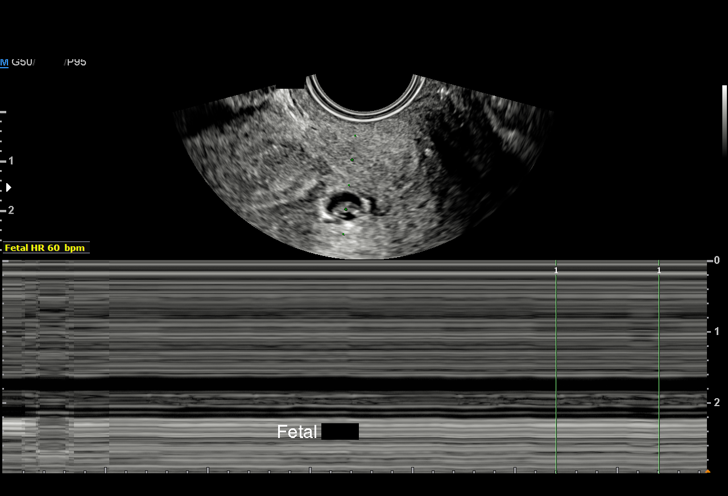
[im 30/30]
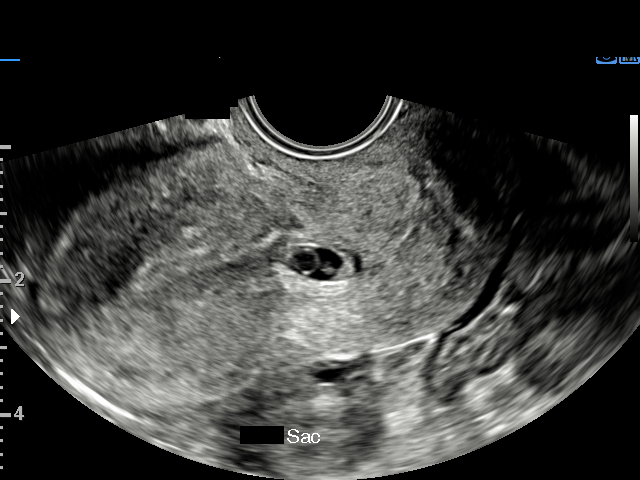

[15 of 28 positions shown; findings below may reference images not displayed]

FINDINGS: Intrauterine gestational sac: Single, located in lower uterine
segment

Yolk sac:  Visualized.

Embryo:  Visualized.

Cardiac Activity: Visualized.

Heart Rate: 62 bpm

CRL:  5 mm   6 w   1 d                  US EDC: 12/29/2018

Subchorionic hemorrhage:  None visualized.

Maternal uterus/adnexae: Neither ovary well visualized on this
study, however no mass or abnormal free fluid identified.
IMPRESSION: Single living IUP measuring 6 weeks 1 day, with US EDC of
12/29/2018.

Poor prognostic factors including low heart rate and location in
lower uterine segment raise suspicion for spontaneous abortion in
progress.

## 2018-09-17 MED ORDER — LABETALOL HCL 200 MG PO TABS: 200.0000 mg | ORAL_TABLET | Freq: Two times a day (BID) | ORAL | 0 refills | Status: AC

## 2018-09-17 NOTE — Telephone Encounter (Signed)
I discussed with Dr. Shawnie PonsPratt and received order for 30 days of labetolol to last until patient is seen by new provider. I called Tarnesha and discussed with her. I encouraged her to talk with new ob provider and her medicaid worker and if she cannot get into care there she may call us and ask again about referral.She voices understanding. ( Also noted [facility] and told HTN and prescribed losartan. Then found out she is pregnant this week at [facility] had + upt. )

## 2018-09-17 NOTE — Telephone Encounter (Signed)
I called Debra Floyd and she states she lives in [place] and is [facility] to [facility] and they said since she has medicaid she has to be referred to them by her pcp or last doctor who saw her.  I explained to her [place] they are talking about whoever is assigned as her pcp on her card. She states she has not seen them and is trying to get that switched. I encouraged her to call that office and see if they will make the referral.  She states her real concern is that she was told she had High blood pressure and is on losartan and knows that she can't take that in pregnancy . States she stopped taking the losartan.  I informed her I would check and see what we can do and call her back.

## 2018-09-23 IMAGING — US US OB TRANSVAGINAL
1 series · 15 of 27 positions shown · non-contrast
Comparison: 05/06/2018

CLINICAL DATA: Viability

EXAM:
TRANSVAGINAL OB ULTRASOUND
TECHNIQUE: Transvaginal ultrasound was performed for complete evaluation of the
gestation as well as the maternal uterus, adnexal regions, and
pelvic cul-de-sac.

[Series 1: us ob transvaginal · 27 acquisitions, 15 frames shown]
[im 1/27]
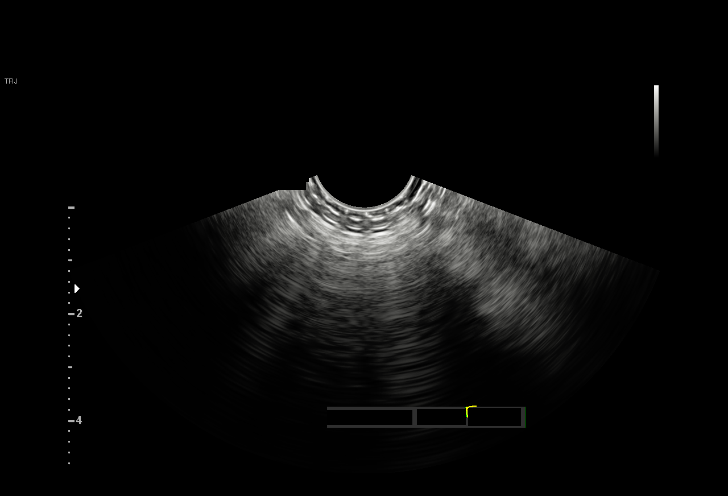
[im 3/27]
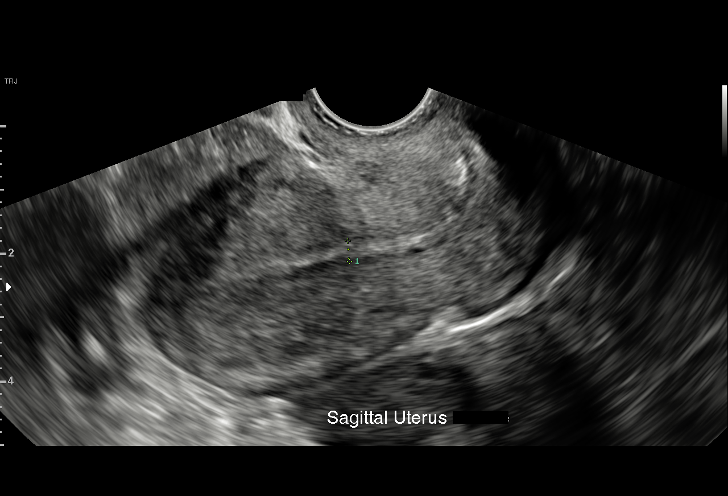
[im 5/27]
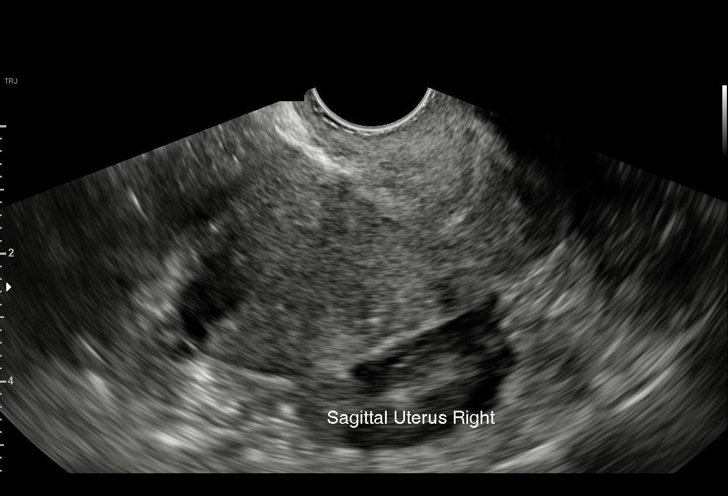
[im 7/27]
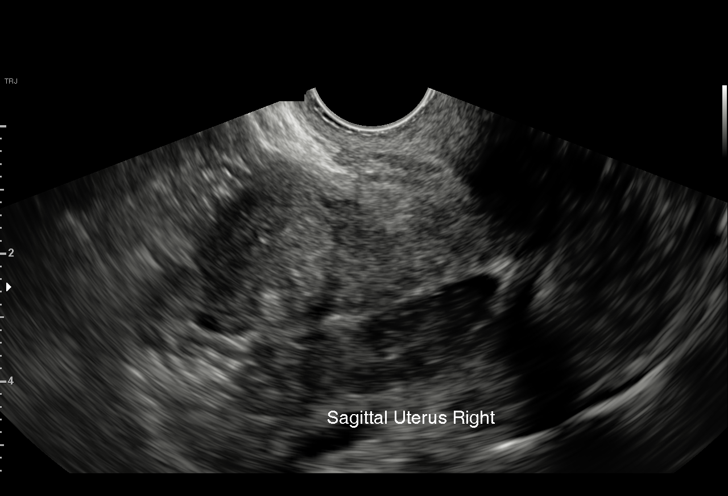
[im 9/27]
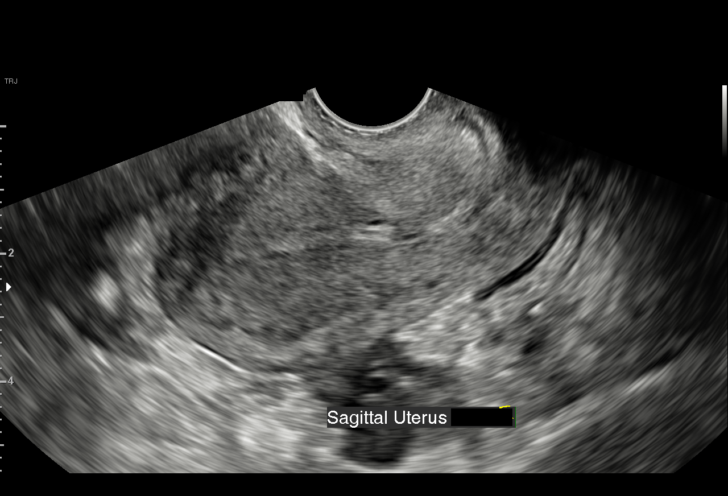
[im 10/27]
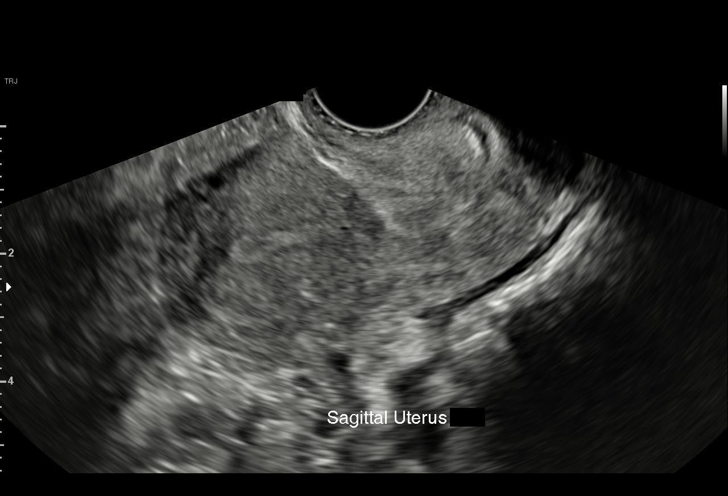
[im 12/27]
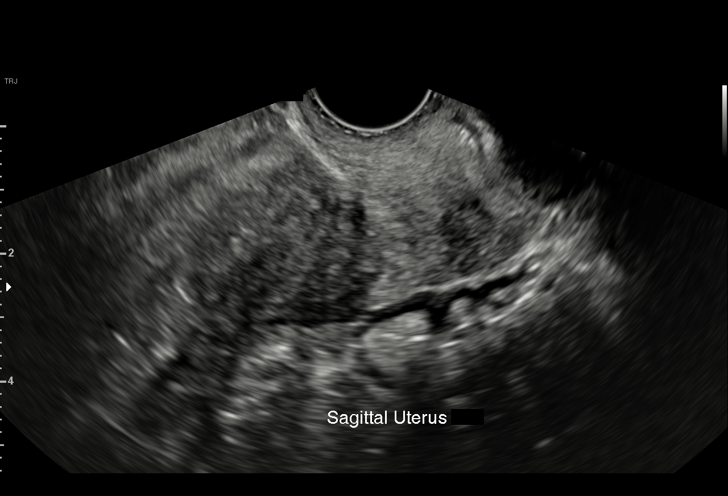
[im 14/27]
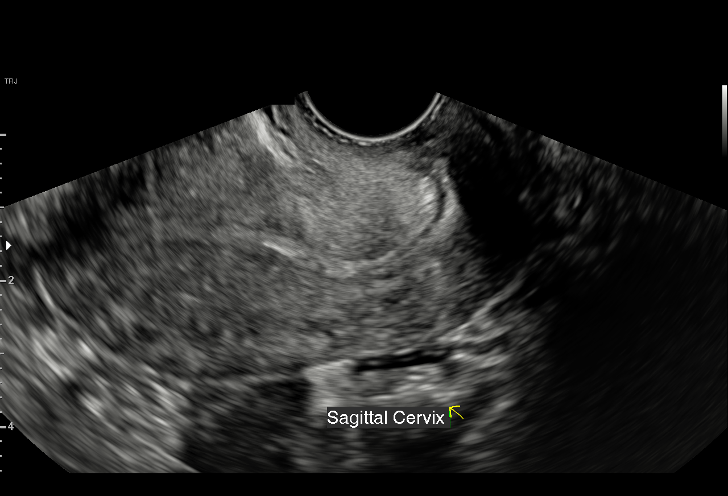
[im 16/27]
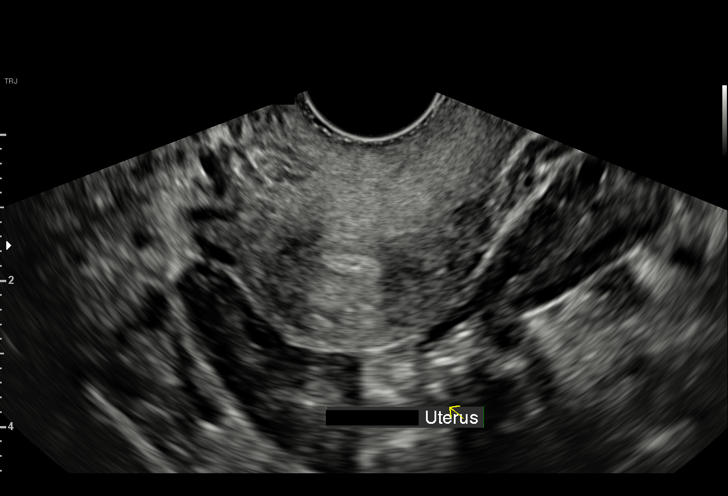
[im 18/27]
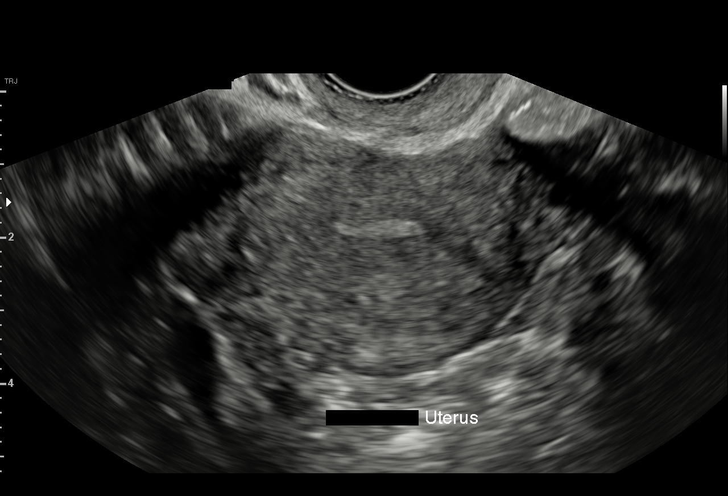
[im 19/27]
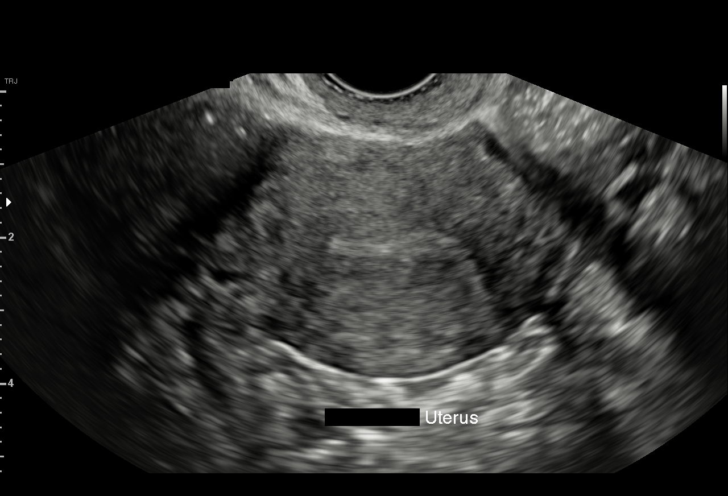
[im 21/27]
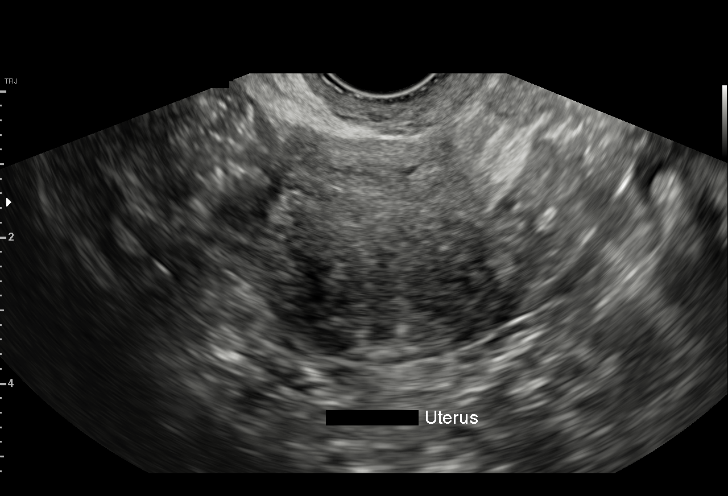
[im 23/27]
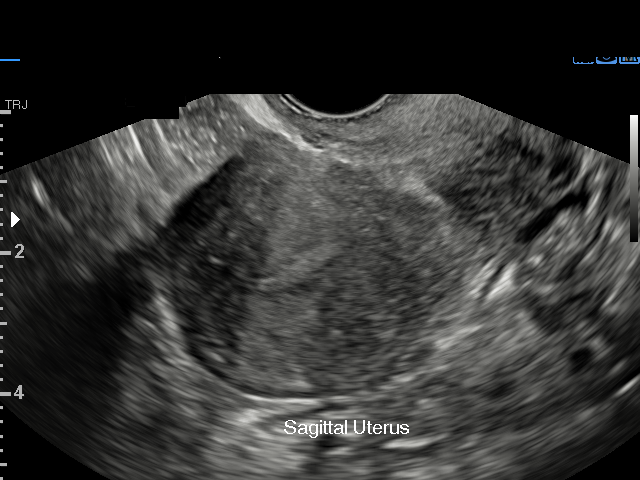
[im 25/27]
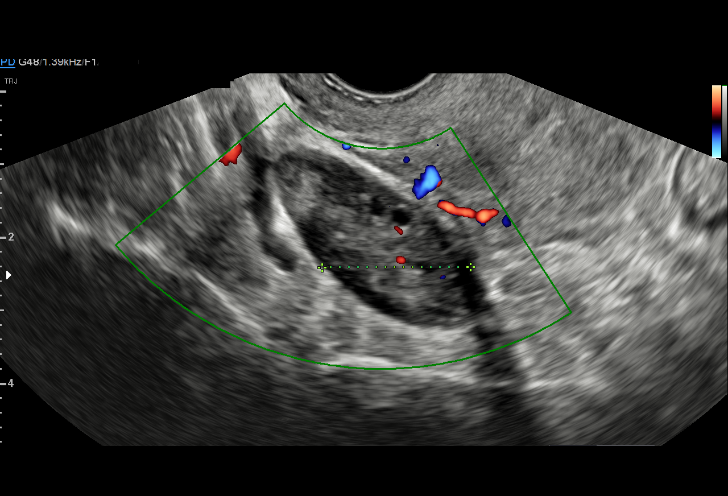
[im 27/27]
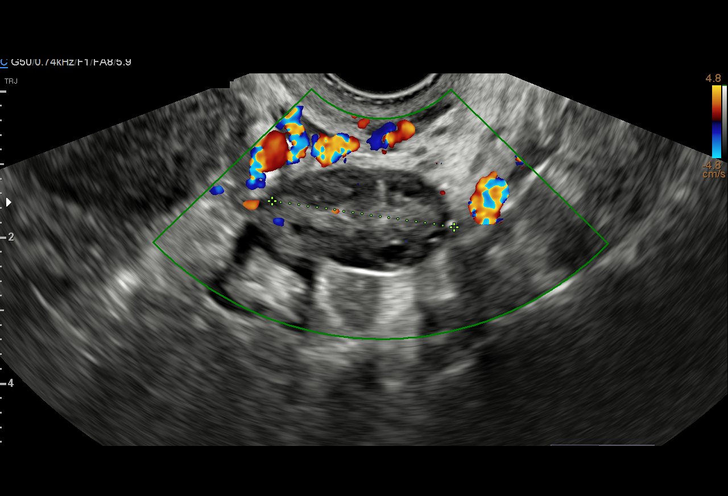

[15 of 27 positions shown; findings below may reference images not displayed]

FINDINGS: Intrauterine gestational sac: Not visualized

Yolk sac:  Not visualized

Embryo:  Not visualized

Cardiac Activity:

Heart Rate:  bpm

MSD:   mm    w     d

CRL:     mm    w  d                  US EDC:

Subchorionic hemorrhage:  None visualized.

Maternal uterus/adnexae: Previously seen lower uterine segment
intrauterine gestational sac no longer visualized compatible with
spontaneous abortion. Endometrium 3 mm in thickness. No adnexal mass
or abnormal free fluid
IMPRESSION: No intrauterine gestational sac visualized compatible with
spontaneous abortion.

## 2019-03-16 ENCOUNTER — Encounter (HOSPITAL_COMMUNITY): Payer: Self-pay
